# Patient Record
Sex: Male | Born: 2000 | Hispanic: Yes | Marital: Single | Smoking: Never smoker
Health system: Southern US, Community
[De-identification: ages and names within clinical notes are randomized; demographics above are authoritative.]

## PROBLEM LIST (undated history)

## (undated) DIAGNOSIS — R569 Unspecified convulsions: Secondary | ICD-10-CM

---
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
## 2021-09-05 ENCOUNTER — Encounter: Payer: Self-pay | Admitting: Emergency Medicine

## 2021-09-05 ENCOUNTER — Emergency Department: Payer: Medicaid Other

## 2021-09-05 ENCOUNTER — Other Ambulatory Visit: Payer: Self-pay

## 2021-09-05 ENCOUNTER — Emergency Department
Admission: EM | Admit: 2021-09-05 | Discharge: 2021-09-05 | Disposition: A | Discharge status: Home / Self Care | Payer: Medicaid Other | Attending: Emergency Medicine | Admitting: Emergency Medicine

## 2021-09-05 DIAGNOSIS — Z20822 Contact with and (suspected) exposure to covid-19: Secondary | ICD-10-CM | POA: Insufficient documentation

## 2021-09-05 DIAGNOSIS — R000 Tachycardia, unspecified: Secondary | ICD-10-CM | POA: Insufficient documentation

## 2021-09-05 DIAGNOSIS — R0602 Shortness of breath: Secondary | ICD-10-CM | POA: Diagnosis present

## 2021-09-05 DIAGNOSIS — J45901 Unspecified asthma with (acute) exacerbation: Secondary | ICD-10-CM | POA: Diagnosis not present

## 2021-09-05 LAB — COMPREHENSIVE METABOLIC PANEL
ALT: 28 U/L (ref 0–44)
AST: 29 U/L (ref 15–41)
Albumin: 4.4 g/dL (ref 3.5–5.0)
Alkaline Phosphatase: 88 U/L (ref 38–126)
Anion gap: 11 (ref 5–15)
BUN: 12 mg/dL (ref 6–20)
CO2: 26 mmol/L (ref 22–32)
Calcium: 9.4 mg/dL (ref 8.9–10.3)
Chloride: 102 mmol/L (ref 98–111)
Creatinine, Ser: 1 mg/dL (ref 0.61–1.24)
GFR, Estimated: >60 mL/min (ref >60)
Glucose, Bld: 107 mg/dL — ABNORMAL HIGH (ref 70–99)
Potassium: 3.1 mmol/L — ABNORMAL LOW (ref 3.5–5.1)
Sodium: 139 mmol/L (ref 135–145)
Total Bilirubin: 1.9 mg/dL — ABNORMAL HIGH (ref 0.3–1.2)
Total Protein: 8.3 g/dL — ABNORMAL HIGH (ref 6.5–8.1)

## 2021-09-05 LAB — RESP PANEL BY RT-PCR (FLU A&B, COVID) ARPGX2
Influenza A by PCR: NEGATIVE
Influenza B by PCR: NEGATIVE
SARS Coronavirus 2 by RT PCR: NEGATIVE

## 2021-09-05 LAB — CBC
HCT: 46.4 % (ref 39.0–52.0)
Hemoglobin: 15.3 g/dL (ref 13.0–17.0)
MCH: 28.1 pg (ref 26.0–34.0)
MCHC: 33 g/dL (ref 30.0–36.0)
MCV: 85.1 fL (ref 80.0–100.0)
Platelets: 258 10*3/uL (ref 150–400)
RBC: 5.45 MIL/uL (ref 4.22–5.81)
RDW: 12 % (ref 11.5–15.5)
WBC: 13.8 10*3/uL — ABNORMAL HIGH (ref 4.0–10.5)
nRBC: 0 % (ref 0.0–0.2)

## 2021-09-05 LAB — TROPONIN I (HIGH SENSITIVITY): Troponin I (High Sensitivity): 4 ng/L (ref <18)

## 2021-09-05 MED ORDER — ALBUTEROL SULFATE HFA 108 (90 BASE) MCG/ACT IN AERS
2.0000 | INHALATION_SPRAY | Freq: Once | RESPIRATORY_TRACT | Status: AC
  Administered 2021-09-05: 2 via RESPIRATORY_TRACT
  Filled 2021-09-05: qty 6.7

## 2021-09-05 MED ORDER — ALBUTEROL SULFATE HFA 108 (90 BASE) MCG/ACT IN AERS: 2.0000 | INHALATION_SPRAY | Freq: Four times a day (QID) | RESPIRATORY_TRACT | 2 refills | Status: AC | PRN

## 2021-09-05 MED ORDER — IPRATROPIUM-ALBUTEROL 0.5-2.5 (3) MG/3ML IN SOLN
3.0000 mL | Freq: Once | RESPIRATORY_TRACT | Status: AC
  Administered 2021-09-05: 3 mL via RESPIRATORY_TRACT
  Filled 2021-09-05: qty 3

## 2021-09-05 MED ORDER — PREDNISONE 10 MG PO TABS: 10.0000 mg | ORAL_TABLET | Freq: Every day | ORAL | 0 refills | Status: AC

## 2021-09-05 NOTE — ED Provider Notes (Signed)
[facility] Provider Note    Event Date/Time   First MD Initiated Contact with Patient 09/05/21 1718     (approximate)  History   Chief Complaint: Allergic Reaction  HPI  Allen Andrade is a 21 y.o. male with a seizure disorder, asthma per patient, presents to the emergency department for shortness of breath.  According to the patient states he has an allergy to cats and is currently staying with a friend who has cats.  States last night he began feeling somewhat short of breath which then worsened overnight into today.  Patient called EMS he was given 50 mg of IV Benadryl 125 mg of IV Solu-Medrol and a DuoNeb and albuterol neb.  Here the patient appears much better he is tachycardic but was just finishing his nebulizer treatment.  Denies any chest pain.  States he has been coughing including last night and states some subjective chills last night although denies measuring a fever/temperature.  98.4 in the emergency department, 97% on room air with a good waveform but tachycardic to 130 or so.  Physical Exam   Triage Vital Signs: ED Triage Vitals  Enc Vitals Group     BP 09/05/21 1726 136/61     Pulse Rate 09/05/21 1726 (!) 146     Resp 09/05/21 1726 15     Temp 09/05/21 1728 98.4 F (36.9 C)     Temp Source 09/05/21 1726 Oral     SpO2 09/05/21 1726 97 %     Weight --      Height --      Head Circumference --      Peak Flow --      Pain Score 09/05/21 1726 0     Pain Loc --      Pain Edu? --      Excl. in [place]? --     Most recent vital signs: Vitals:   09/05/21 1726 09/05/21 1728  BP: 136/61   Pulse: (!) 146   Resp: 15   Temp:  98.4 F (36.9 C)  SpO2: 97%     General: Awake, no distress.  CV:  Good peripheral perfusion.  Giller rhythm rate around 120 to 130 bpm. Resp:  Mild tachypnea but no obvious wheeze rales rhonchi on exam. Abd:  No distention.  Soft, nontender.  No rebound or guarding.   ED Results / Procedures / Treatments    EKG  EKG viewed and interpreted by myself shows sinus tachycardia 131 bpm with a narrow QRS, normal axis, normal intervals, nonspecific but no concerning ST changes.  RADIOLOGY  I reviewed and interpreted the chest x-ray images I do not see any large consolidation on my evaluation. Radiology is read the chest x-ray is negative.   MEDICATIONS ORDERED IN ED: Medications - No data to display   IMPRESSION / MDM / ASSESSMENT AND PLAN / ED COURSE  I reviewed the triage vital signs and the nursing notes.  Patient's presentation is most consistent with acute presentation with potential threat to life or bodily function.  Patient presents emergency department for shortness of breath.  Patient states a history of asthma but does not take any medications currently including no albuterol inhaler.  Patient states shortness of breath worsened last night when he is staying with a friend who has cats and he has a cat allergy.  Patient states shortness of breath worsened overnight and into today.  Differential would include allergies, asthma exacerbation, allergic reaction, pneumonia, infectious etiology such as  COVID.  We will check labs including a troponin and EKG we will obtain a chest x-ray as well as a COVID swab.  Patient agreeable to plan.  States he is feeling better.  Chest x-ray is negative.  Lab work is reassuring, COVID/flu is negative.  CBC and chemistry are normal.  Troponin negative.  Patient states he is feeling much better on my recheck/reevaluation patient does have expiratory wheeze once again although mild.  We will dose 1 DuoNeb.  We will attempt to obtain an albuterol inhaler to discharge with the patient from pharmacy.  We will place patient on prednisone taper and refill albuterol prescription.  Patient agreeable to plan of care.  Discussed my typical asthma return precautions.  FINAL CLINICAL IMPRESSION(S) / ED DIAGNOSES   Dyspnea Reactive airway disease Asthma  exacerbation  Note:  This document was prepared using Dragon voice recognition software and may include unintentional dictation errors.   Minna Antis, MD 09/05/21 2016

## 2021-09-05 NOTE — ED Triage Notes (Signed)
Pt to ED via [facility] from home for allergic reaction. Pt states that he is allergic to cats and that he is staying with a friend who has cats. Last night he started to feel short of breath and this got worse today. Pt was given Sol u medrol 125 mg, Benadryl 50 mg, 1 duoNeb, and 1 Albuterol treatment in route by EMS.

## 2021-09-05 NOTE — ED Notes (Signed)
18G ems IV removed with catheter intact at this time. Site clean and dry.

## 2022-02-07 ENCOUNTER — Emergency Department
Admission: EM | Admit: 2022-02-07 | Discharge: 2022-02-07 | Disposition: A | Discharge status: Home / Self Care | Payer: Medicaid Other | Attending: Emergency Medicine | Admitting: Emergency Medicine

## 2022-02-07 ENCOUNTER — Emergency Department: Payer: Medicaid Other

## 2022-02-07 ENCOUNTER — Other Ambulatory Visit: Payer: Self-pay

## 2022-02-07 DIAGNOSIS — W228XXA Striking against or struck by other objects, initial encounter: Secondary | ICD-10-CM | POA: Diagnosis not present

## 2022-02-07 DIAGNOSIS — S63614A Unspecified sprain of right ring finger, initial encounter: Secondary | ICD-10-CM | POA: Insufficient documentation

## 2022-02-07 DIAGNOSIS — S6991XA Unspecified injury of right wrist, hand and finger(s), initial encounter: Secondary | ICD-10-CM | POA: Diagnosis present

## 2022-02-07 NOTE — Discharge Instructions (Signed)
You have been seen today in the emergency room for injury to your right ring finger.  Your x-rays were normal.  Please take ibuprofen or Tylenol for your pain or discomfort.  Follow-up with your primary care provider if symptoms persist.

## 2022-02-07 NOTE — ED Triage Notes (Signed)
Pt states his right hand slipped when using a wrench, causing his right ring finger to hit a wall. Cms intact.

## 2022-02-07 NOTE — ED Provider Notes (Signed)
[facility] Emergency Department Provider Note   ____________________________________________   Event Date/Time   First MD Initiated Contact with Patient 02/07/22 1514     (approximate)  I have reviewed the triage vital signs and the nursing notes.   HISTORY  Chief Complaint Finger Injury    HPI Allen Andrade is a 22 y.o. male presents to the emergency room complaining of right ring finger pain.  He reports that he was walking earlier today and was turning a wrench when his finger slipped and hit a wall.  Patient has not taken anything for the pain at this time.  Patient has full range of motion to hand/finger.  Patient has good sensation.  Cap refill is less than 2 seconds.  Right ring finger shows no obvious abnormality/swelling/erythema.   Past Medical History:  Diagnosis Date   Seizures ([place])     There are no problems to display for this patient.   History reviewed. No pertinent surgical history.  Prior to Admission medications   Medication Sig Start Date End Date Taking? Authorizing Provider  albuterol (VENTOLIN HFA) 108 (90 Base) MCG/ACT inhaler Inhale 2 puffs into the lungs every 6 (six) hours as needed for wheezing or shortness of breath. 09/05/21   Harvest Dark, MD  predniSONE (DELTASONE) 10 MG tablet Take 1 tablet (10 mg total) by mouth daily. Day 1-3: take 4 tablets PO daily Day 4-6: take 3 tablets PO daily Day 7-9: take 2 tablets PO daily Day 10-12: take 1 tablet PO daily 09/05/21   Harvest Dark, MD    Allergies Patient has no known allergies.  History reviewed. No pertinent family history.  Social History Social History   Tobacco Use   Smoking status: Never   Smokeless tobacco: Never  Substance Use Topics   Alcohol use: Never   Drug use: Never    Review of Systems  Constitutional: No fever/chills Eyes: No visual changes. ENT: No sore throat. Cardiovascular: Denies chest pain. Respiratory: Denies shortness of  breath. Gastrointestinal: No abdominal pain.  No nausea, no vomiting.  No diarrhea.  No constipation. Genitourinary: Negative for dysuria. Musculoskeletal: Positive for right ring finger pain Skin: Negative for rash. Neurological: Negative for headaches, focal weakness or numbness.   ____________________________________________   PHYSICAL EXAM:  VITAL SIGNS: ED Triage Vitals  Enc Vitals Group     BP 02/07/22 1454 (!) 147/92     Pulse Rate 02/07/22 1454 98     Resp 02/07/22 1454 17     Temp 02/07/22 1454 98.5 F (36.9 C)     Temp src --      SpO2 02/07/22 1454 100 %     Weight 02/07/22 1455 230 lb (104.3 kg)     Height 02/07/22 1455 6' (1.829 m)     Head Circumference --      Peak Flow --      Pain Score 02/07/22 1455 8     Pain Loc --      Pain Edu? --      Excl. in [place]? --     Constitutional: Alert and oriented. Well appearing and in no acute distress. Eyes: Conjunctivae are normal. PERRL. EOMI. Head: Atraumatic. Nose: No congestion/rhinnorhea. Mouth/Throat: Mucous membranes are moist.  Oropharynx non-erythematous. Neck: No stridor.   Cardiovascular: Normal rate, regular rhythm. Grossly normal heart sounds.  Good peripheral circulation. Respiratory: Normal respiratory effort.  No retractions. Lungs CTAB. Gastrointestinal: Soft and nontender. No distention. No abdominal bruits. No CVA tenderness. Musculoskeletal: Right ring  finger shows no obvious signs of abnormality/redness/swelling.  Patient has full range of motion to finger.  Sensation is intact.  Skin is intact. Neurologic:  Normal speech and language. No gross focal neurologic deficits are appreciated. No gait instability. Skin:  Skin is warm, dry and intact. No rash noted. Psychiatric: Mood and affect are normal. Speech and behavior are normal.  ____________________________________________   LABS (all labs ordered are listed, but only abnormal results are displayed)  Labs Reviewed - No data to  display ____________________________________________  EKG   ____________________________________________  RADIOLOGY  ED MD interpretation: X-ray of right ring finger reviewed by me and read by radiologist.  Official radiology report(s): DG Finger Ring Right  Result Date: 02/07/2022 CLINICAL DATA:  Injury to right fourth digit EXAM: RIGHT RING FINGER 2+V COMPARISON:  None Available. FINDINGS: Frontal, oblique, lateral views of the right fourth digit are obtained. No fracture, subluxation, or dislocation. Joint spaces are well preserved. Soft tissues are normal. IMPRESSION: 1. Unremarkable right fourth digit. Electronically Signed   By: Randa Ngo M.D.   On: 02/07/2022 15:33    ____________________________________________   PROCEDURES  Procedure(s) performed: None  Procedures  Critical Care performed: No  ____________________________________________   INITIAL IMPRESSION / ASSESSMENT AND PLAN / ED COURSE     [name] is a 22 y.o. male presents to the emergency room complaining of right ring finger pain.  He reports that he was walking earlier today and was turning a wrench when his finger slipped and hit a wall.  Patient has not taken anything for the pain at this time.  Patient has full range of motion to hand/finger.  Patient has good sensation.  Cap refill is less than 2 seconds.  Right ring finger shows no obvious abnormality/swelling/erythema.   Will obtain x-ray of right ring finger. X-ray of right ring finger shows no abnormalities.  Patient will be discharged home in stable condition at this time.     ____________________________________________   FINAL CLINICAL IMPRESSION(S) / ED DIAGNOSES  Final diagnoses:  Sprain of right ring finger, unspecified site of digit, initial encounter     ED Discharge Orders     None        Note:  This document was prepared using Dragon voice recognition software and may include unintentional dictation  errors.     Willaim Rayas, NP 02/07/22 1546    Duffy Bruce, MD 02/08/22 [phone]
